# Patient Record
Sex: Male | Born: 1955 | Race: White | Hispanic: No | Marital: Married | Smoking: Former smoker
Health system: Southern US, Community
[De-identification: ages and names within clinical notes are randomized; demographics above are authoritative.]

## PROBLEM LIST (undated history)

## (undated) DIAGNOSIS — E785 Hyperlipidemia, unspecified: Secondary | ICD-10-CM

## (undated) DIAGNOSIS — E119 Type 2 diabetes mellitus without complications: Secondary | ICD-10-CM

## (undated) DIAGNOSIS — I10 Essential (primary) hypertension: Secondary | ICD-10-CM

## (undated) HISTORY — PX: EYE SURGERY: SHX253

## (undated) HISTORY — DX: Essential (primary) hypertension: I10

## (undated) HISTORY — DX: Hyperlipidemia, unspecified: E78.5

## (undated) HISTORY — DX: Type 2 diabetes mellitus without complications: E11.9

## (undated) HISTORY — PX: CYST EXCISION: SHX5701

---

## 2011-08-21 ENCOUNTER — Ambulatory Visit: Payer: Self-pay | Admitting: Internal Medicine

## 2011-08-21 LAB — URINALYSIS, COMPLETE
Bacteria: NEGATIVE
Bilirubin,UR: NEGATIVE
Glucose,UR: 100 mg/dL (ref 0–75)
Leukocyte Esterase: NEGATIVE
Nitrite: NEGATIVE

## 2011-08-22 LAB — URINE CULTURE

## 2011-08-28 ENCOUNTER — Ambulatory Visit: Payer: Self-pay | Admitting: Family Medicine

## 2011-09-03 ENCOUNTER — Ambulatory Visit: Payer: Self-pay | Admitting: Urology

## 2011-09-17 ENCOUNTER — Ambulatory Visit: Payer: Self-pay | Admitting: Family Medicine

## 2011-09-23 ENCOUNTER — Ambulatory Visit: Payer: Self-pay | Admitting: Family Medicine

## 2013-01-19 ENCOUNTER — Ambulatory Visit: Payer: Self-pay | Admitting: Urology

## 2016-07-07 ENCOUNTER — Other Ambulatory Visit: Payer: Self-pay | Admitting: Nurse Practitioner

## 2016-07-07 DIAGNOSIS — F0390 Unspecified dementia without behavioral disturbance: Secondary | ICD-10-CM

## 2016-07-23 ENCOUNTER — Ambulatory Visit: Admission: RE | Admit: 2016-07-23 | Payer: PRIVATE HEALTH INSURANCE | Source: Ambulatory Visit

## 2016-08-18 ENCOUNTER — Ambulatory Visit: Admission: RE | Admit: 2016-08-18 | Payer: PRIVATE HEALTH INSURANCE | Source: Ambulatory Visit

## 2016-08-26 ENCOUNTER — Ambulatory Visit
Admission: RE | Admit: 2016-08-26 | Discharge: 2016-08-26 | Disposition: A | Discharge status: Home / Self Care | Payer: PRIVATE HEALTH INSURANCE | Source: Ambulatory Visit | Attending: Nurse Practitioner | Admitting: Nurse Practitioner

## 2016-08-26 DIAGNOSIS — F0280 Dementia in other diseases classified elsewhere without behavioral disturbance: Secondary | ICD-10-CM | POA: Insufficient documentation

## 2016-08-26 DIAGNOSIS — F0390 Unspecified dementia without behavioral disturbance: Secondary | ICD-10-CM | POA: Diagnosis present

## 2016-08-26 DIAGNOSIS — G319 Degenerative disease of nervous system, unspecified: Secondary | ICD-10-CM | POA: Diagnosis not present

## 2016-08-26 MED ORDER — GADOBENATE DIMEGLUMINE 529 MG/ML IV SOLN
20.0000 mL | Freq: Once | INTRAVENOUS | Status: AC | PRN
  Administered 2016-08-26: 20 mL via INTRAVENOUS

## 2016-09-15 ENCOUNTER — Encounter (INDEPENDENT_AMBULATORY_CARE_PROVIDER_SITE_OTHER): Payer: Self-pay | Admitting: Vascular Surgery

## 2016-09-15 ENCOUNTER — Ambulatory Visit (INDEPENDENT_AMBULATORY_CARE_PROVIDER_SITE_OTHER): Payer: PRIVATE HEALTH INSURANCE | Admitting: Vascular Surgery

## 2016-09-15 VITALS — BP 129/73 | HR 73 | Resp 17 | Ht 67.0 in | Wt 173.2 lb

## 2016-09-15 DIAGNOSIS — E119 Type 2 diabetes mellitus without complications: Secondary | ICD-10-CM | POA: Diagnosis not present

## 2016-09-15 DIAGNOSIS — I10 Essential (primary) hypertension: Secondary | ICD-10-CM | POA: Insufficient documentation

## 2016-09-15 DIAGNOSIS — Q282 Arteriovenous malformation of cerebral vessels: Secondary | ICD-10-CM

## 2016-09-15 DIAGNOSIS — E785 Hyperlipidemia, unspecified: Secondary | ICD-10-CM

## 2016-09-15 NOTE — Assessment & Plan Note (Signed)
The patient has headaches and early onset Alzheimer's dementia. He underwent an MRI study which I have independently reviewed. 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 Given this finding, I believe he should be referred to a neurosurgeon for further evaluation and treatment. We had a long discussion about AV malformations. We discussed that I treat AV malformations throughout much of the body, but I do not treat intracranial AV malformations. We will try to obtain a referral in the near future at his convenience. I was able to answer several questions regarding AV malformations including their natural history, pathophysiology, and treatment options that may be available. I will defer to the neurosurgeon to discuss whether or not this should be treated and the best treatment option.

## 2016-09-15 NOTE — Patient Instructions (Signed)
Brain Arteriovenous Malformation, Adult A brain arteriovenous malformation (AVM) is a condition that means your arteries and veins are tangled. The veins bring blood to the heart and lungs. The arteries bring blood away from the heart and to the brain. If they are tangled, blood cannot travel to where it is needed. Brain AVM may also lead to bleeding in the brain (hemorrhage), which can be life-threatening. Most brain AVMs are present since birth (congenital). What are the causes? It is not known what causes brain AVM. Genes that are passed down through families may cause some types of AVM. What are the signs or symptoms? Symptoms of this condition depend on which area of the brain is affected and the amount of damage. Symptoms may include:  Headache.  Dizziness.  Vision changes.  Seizure.  Weakness or loss of movement in part of the body.  Tingling or numbness in part of the body.  Loss of ability to speak.  Clumsiness.  Confusion or memory loss.  Seeing things that are not there (hallucinations).  Fainting, if the AVM ruptures. You may not have any symptoms. How is this diagnosed? Your health care provider may suspect a brain AVM based on your symptoms and medical history. You will have a physical exam and will also have tests done, which may include:  MRI or magnetic resonance angiogram (MRA).  CT scan or CT angiogram (CTA).  Cerebral angiogram.  Electroencephalogram (EEG) if your health care provider thinks that you have had a seizure. How is this treated? Treatment will depend on the size, location, and severity of the brain AVM. Treatment may include:  Surgery to remove the AVM (craniotomy).  Embolization. This involves closing off the vessels of the AVM by injecting glue into them.  Radiosurgery. This involves focusing radiation on the AVM.  Medicines to control your symptoms, such as seizures or headaches.  Monitoring the AVM with imaging tests to make sure  it is not growing. Follow these instructions at home:  Learn as much as you can about your condition and work closely with your team of health care providers.  Take over-the-counter and prescription medicines only as told by your health care provider. Do not take blood thinners, such as aspirin, ibuprofen, NSAIDs, or warfarin, unless your health care provider tells you to do that.  Keep all follow-up visits as told by your health care provider. This is important. Get help right away if:  You have a sudden, severe headache with no known cause.  You have nausea or vomiting occurring with another symptom.  You have sudden weakness or numbness of your face, arm, or leg, especially on one side of your body.  You have sudden trouble walking or difficulty moving your arms or legs.  You have sudden confusion.  You have sudden trouble speaking, understanding, or both (aphasia).  You have sudden trouble seeing in one or both of your eyes.  You have a sudden loss of balance or coordination.  You have a stiff neck.  You have difficulty breathing.  You have partial or total loss of consciousness. These symptoms may represent a serious problem that is an emergency. Do not wait to see if the symptoms will go away. Get medical help right away. Call your local emergency services (911 in the [place] Do not drive yourself to the hospital.  This information is not intended to replace advice given to you by your health care provider. Make sure you discuss any questions you have with your health care  provider. Document Released: 07/03/2002 Document Revised: 01/31/2016 Document Reviewed: 11/22/2015 Elsevier Interactive Patient Education  2017 [employer]

## 2016-09-15 NOTE — Progress Notes (Signed)
Patient ID: Jesus Acevedo, male   DOB: 05-09-1956, 61 y.o.   MRN: [id number]  Chief Complaint  Patient presents with  . New Patient (Initial Visit)    HPI Jesus Acevedo is a 61 y.o. male.  I am asked to see the patient by Dr. Sherryll Burger for evaluation of an intracranial AVM.  The patient reports Dull aching headaches much of the time. These have not really changed in their intensity or frequency. There is no clear inciting event or causative factor that started them. They have been present for months to years. He has been recently diagnosed with Alzheimer's disease which was one of the reasons for the MRI study. Cognition and memory have been slowly declining. Again, there is no clear inciting event or causative factor. He underwent an MRI study which I have independently reviewed. 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 He is referred for further evaluation and treatment.   Past Medical History:  Diagnosis Date  . Diabetes mellitus without complication (HCC)   . Hyperlipidemia   . Hypertension     Past Surgical History:  Procedure Laterality Date  . CYST EXCISION    . EYE SURGERY      Family History  Problem Relation Age of Onset  . Hypertension Mother   . Aortic aneurysm Father   . Congestive Heart Failure Father   . Heart attack Father   . Hyperlipidemia Father   . Hypertension Father   . Cancer Paternal Grandmother   . Hypertension Paternal Grandmother   . Heart attack Paternal Grandfather   . Hyperlipidemia Paternal Grandfather   . Hypertension Paternal Grandfather      Social History Social History  Substance Use Topics  . Smoking status: Former Games developer  . Smokeless tobacco: Never Used  . Alcohol use Yes     Comment: occasionally  Married, wife accompanies him today.  No Known Allergies  Current Outpatient Prescriptions  Medication Sig Dispense Refill  . aspirin EC 81 MG tablet Take by mouth.    . donepezil (ARICEPT ODT) 10  MG disintegrating tablet Once Daily in the Morning with Food    . losartan-hydrochlorothiazide (HYZAAR) 50-12.5 MG tablet TAKE ONE TABLET BY MOUTH ONCE DAILY    . lovastatin (MEVACOR) 40 MG tablet Take by mouth.    . metFORMIN (GLUCOPHAGE) 500 MG tablet Take by mouth.     No current facility-administered medications for this visit.       REVIEW OF SYSTEMS (Negative unless checked)  Constitutional: [] Weight loss  [] Fever  [] Chills Cardiac: [] Chest pain   [] Chest pressure   [] Palpitations   [] Shortness of breath when laying flat   [] Shortness of breath at rest   [] Shortness of breath with exertion. Vascular:  [] Pain in legs with walking   [] Pain in legs at rest   [] Pain in legs when laying flat   [] Claudication   [] Pain in feet when walking  [] Pain in feet at rest  [] Pain in feet when laying flat   [] History of DVT   [] Phlebitis   [] Swelling in legs   [] Varicose veins   [] Non-healing ulcers Pulmonary:   [] Uses home oxygen   [] Productive cough   [] Hemoptysis   [] Wheeze  [] COPD   [] Asthma Neurologic:  [] Dizziness  [] Blackouts   [] Seizures   [] History of stroke   [] History of TIA  [] Aphasia   [] Temporary blindness   [] Dysphagia   [] Weakness or numbness in arms   [] Weakness or  numbness in legs  X positive for memory issues, dementia, and headaches Musculoskeletal:  [] Arthritis   [] Joint swelling   [] Joint pain   [] Low back pain Hematologic:  [] Easy bruising  [] Easy bleeding   [] Hypercoagulable state   [] Anemic  [] Hepatitis Gastrointestinal:  [] Blood in stool   [] Vomiting blood  [] Gastroesophageal reflux/heartburn   [] Abdominal pain Genitourinary:  [] Chronic kidney disease   [] Difficult urination  [] Frequent urination  [] Burning with urination   [] Hematuria Skin:  [] Rashes   [] Ulcers   [] Wounds Psychological:  [] History of anxiety   []  History of major depression.    Physical Exam BP 129/73   Pulse 73   Resp 17   Ht 5\' 7"  (1.702 m)   Wt 173 lb 3.2 oz (78.6 kg)   BMI 27.13 kg/m  Gen:   WD/WN, NAD Head: [place]/AT, No temporalis wasting. Ear/Nose/Throat: Hearing grossly intact, nares w/o erythema or drainage, oropharynx w/o Erythema/Exudate Eyes: Conjunctiva clear, sclera non-icteric  Neck: trachea midline.  No JVD.  Pulmonary:  Good air movement, no use of accessory muscles Cardiac: RRR, normal S1, S2 Vascular:  Vessel Right Left  Radial Palpable Palpable                                   Gastrointestinal: soft, non-tender/non-distended. No guarding/reflex. No masses, surgical incisions, or scars. Musculoskeletal: M/S 5/5 throughout.  Extremities without ischemic changes.  No deformity or atrophy.  Neurologic:  Symmetrical.  Speech is fluent. Motor exam as listed above. Psychiatric: Judgment intact, Mood & affect appropriate for pt's clinical situation. Dermatologic: No rashes or ulcers noted.  No cellulitis or open wounds.    Radiology Mr Laqueta Jean Wo Contrast  Addendum Date: 09/01/2016   ADDENDUM REPORT: 09/01/2016 13:48 ADDENDUM: There is evidence of a small AVM centered in the right splenium of the corpus callosum with a 1.4 cm nidus. There is drainage into an enlarged vein along the medial ependymal surface of the atrium of the right lateral ventricle and into the right internal cerebral vein. There is also a draining vein which courses superiorly in the interhemispheric fissure along the right parietal lobe and eventually into the superior sagittal sinus near the vertex. There also appears to be an enlarged basal vein of Rosenthal on the right. The right PCA is asymmetrically enlarged and likely reflects the arterial supply. This is a Spetzler-Martin grade 2 AVM. This was reviewed via telephone with Webb Silversmith on 09/01/2016 at 1:30 p.m. Electronically Signed   By: Sebastian Ache M.D.   On: 09/01/2016 13:48   Result Date: 09/01/2016 CLINICAL DATA:  Dementia.  Headaches. EXAM: MRI HEAD WITHOUT AND WITH CONTRAST TECHNIQUE: Multiplanar, multiecho pulse sequences of the  brain and surrounding structures were obtained without and with intravenous contrast. CONTRAST:  20mL MULTIHANCE GADOBENATE DIMEGLUMINE 529 MG/ML IV SOLN COMPARISON:  None. FINDINGS: Brain: There is no evidence of acute infarct, intracranial hemorrhage, mass, midline shift, or extra-axial fluid collection. There is mild generalized cerebral atrophy evidenced by slight ventricular enlargement. The cerebral sulci are normal. No significant cerebral white matter disease is seen. No abnormal enhancement is identified. Vascular: Major intracranial vascular flow voids are preserved. Skull and upper cervical spine: Unremarkable bone marrow signal. Sinuses/Orbits: Unremarkable orbits.  No significant sinus disease. Other: None. IMPRESSION: 1. No acute intracranial abnormality or mass. 2. Mild cerebral atrophy. Electronically Signed: By: Sebastian Ache M.D. On: 08/26/2016 09:33    Labs No results found  for this or any previous visit (from the past 2160 hour(s)).  Assessment/Plan:  Diabetes (HCC) blood glucose control important in reducing the progression of atherosclerotic disease. Also, involved in wound healing. On appropriate medications.   Essential hypertension, benign blood pressure control important in reducing the progression of atherosclerotic disease. On appropriate oral medications.   Hyperlipidemia lipid control important in reducing the progression of atherosclerotic disease. Continue statin therapy   Cerebral AV malformation The patient has headaches and early onset Alzheimer's dementia. He underwent an MRI study which I have independently reviewed. 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 Given this finding, I believe he should be referred to a neurosurgeon for further evaluation and treatment. We had a long discussion about AV malformations. We discussed that I treat AV malformations throughout much of the body, but I do not treat intracranial AV  malformations. We will try to obtain a referral in the near future at his convenience. I was able to answer several questions regarding AV malformations including their natural history, pathophysiology, and treatment options that may be available. I will defer to the neurosurgeon to discuss whether or not this should be treated and the best treatment option.      Festus BarrenJason Kevron Patella 09/15/2016, 11:43 AM   This note was created with Dragon medical transcription system.  Any errors from dictation are unintentional.

## 2016-09-15 NOTE — Assessment & Plan Note (Signed)
lipid control important in reducing the progression of atherosclerotic disease. Continue statin therapy  

## 2016-09-15 NOTE — Assessment & Plan Note (Signed)
blood pressure control important in reducing the progression of atherosclerotic disease. On appropriate oral medications.  

## 2016-09-15 NOTE — Assessment & Plan Note (Signed)
blood glucose control important in reducing the progression of atherosclerotic disease. Also, involved in wound healing. On appropriate medications.  

## 2018-09-29 IMAGING — MR MR HEAD WO/W CM
9 of 10 series · 41 of 48 positions shown · IV contrast (multihance)
Comparison: None.

ADDENDUM:
There is evidence of a small AVM centered in the right splenium of
the corpus callosum with a 1.4 cm nidus. There is drainage into an
enlarged vein along the medial ependymal surface of the atrium of
the right lateral ventricle and into the right internal cerebral
vein. There is also a draining vein which courses superiorly in the
interhemispheric fissure along the right parietal lobe and
eventually into the superior sagittal sinus near the vertex. There
also appears to be an enlarged basal vein of Cebreros on the right.
The right PCA is asymmetrically enlarged and likely reflects the
arterial supply. This is Chen Brumbaugh grade 2 AVM. This was
reviewed via telephone with Ramalabasov Starostenkova on 09/01/2016 at [DATE]
p.m.
CLINICAL DATA: Dementia.  Headaches.

EXAM:
MRI HEAD WITHOUT AND WITH CONTRAST
TECHNIQUE: Multiplanar, multiecho pulse sequences of the brain and surrounding
structures were obtained without and with intravenous contrast.
CONTRAST:  20mL MULTIHANCE GADOBENATE DIMEGLUMINE 529 MG/ML IV SOLN

[Series 2: T1 · sagittal · 5.0mm · 0.45mm/px · 3 of 23 slices shown]
[im 1/23]
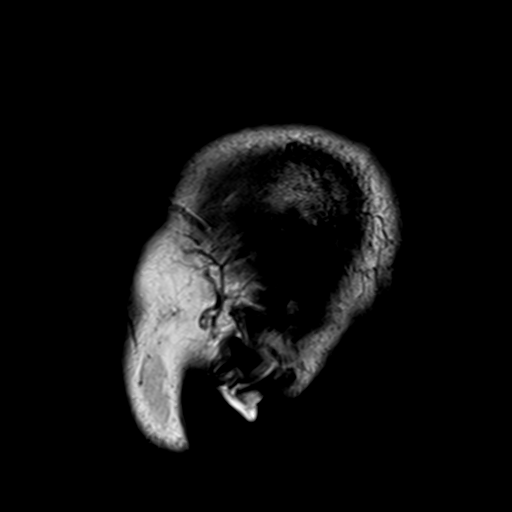
[im 12/23]
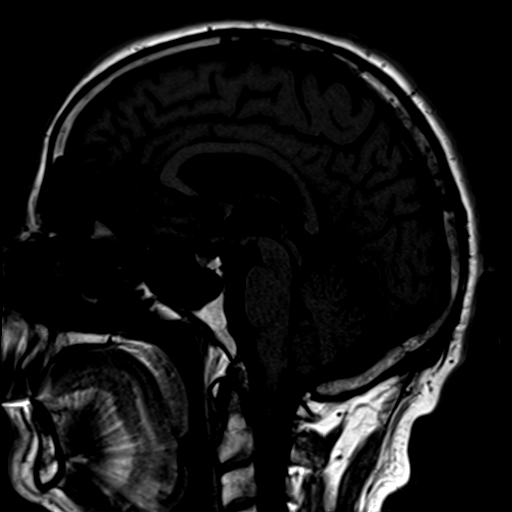
[im 23/23]
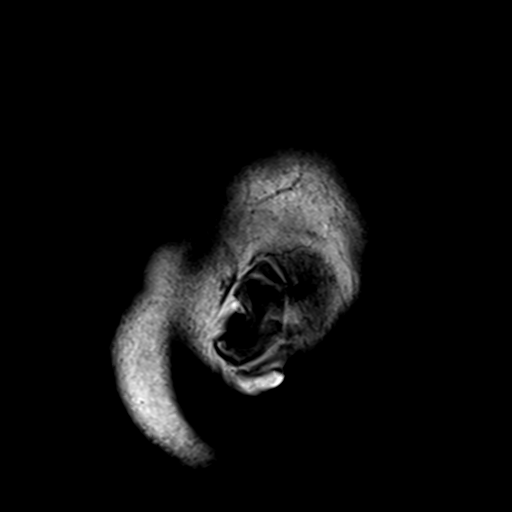

[Series 4: DWI · axial · 3.0mm · 1.20mm/px · z∈[-40,+129]mm · 7 of 59 slices shown (1 of 2)]
[im 1/59]
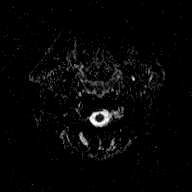
[im 10/59]
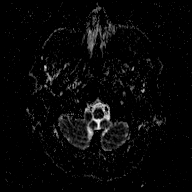
[im 20/59]
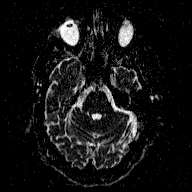
[im 30/59]
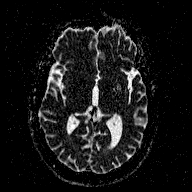
[im 39/59]
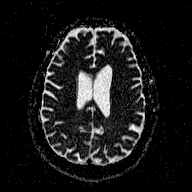
[im 49/59]
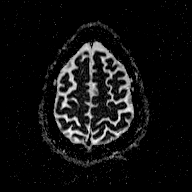
[im 59/59]
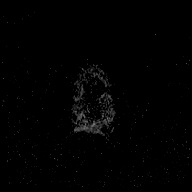

[Series 5: T2 · axial · 5.0mm · 0.72mm/px · z∈[-41,+130]mm · 3 of 28 slices shown (1 of 2)]
[im 1/28]
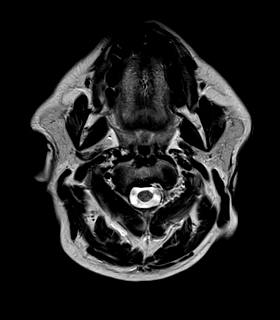
[im 14/28]
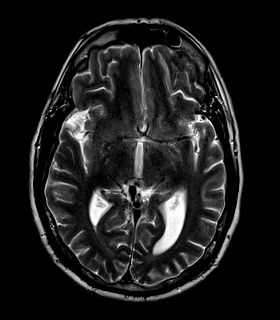
[im 28/28]
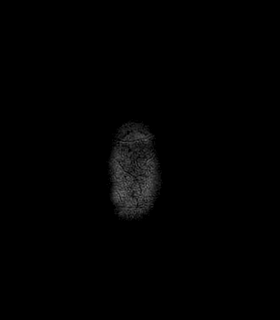

[Series 6: FLAIR · axial · 5.0mm · 0.45mm/px · z∈[-41,+130]mm · 3 of 28 slices shown]
[im 1/28]
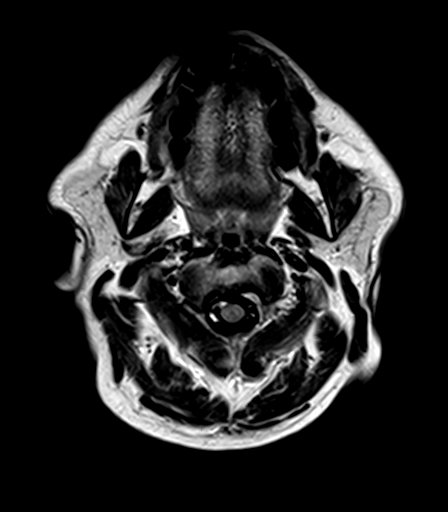
[im 14/28]
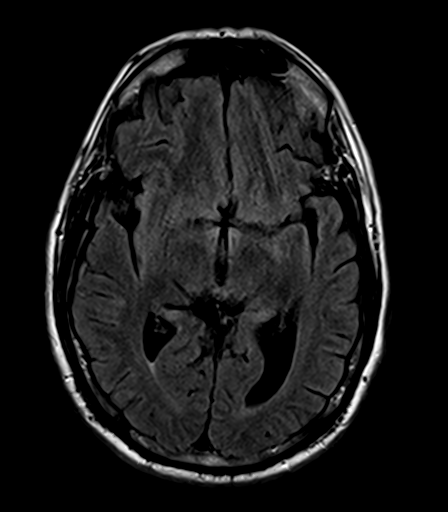
[im 28/28]
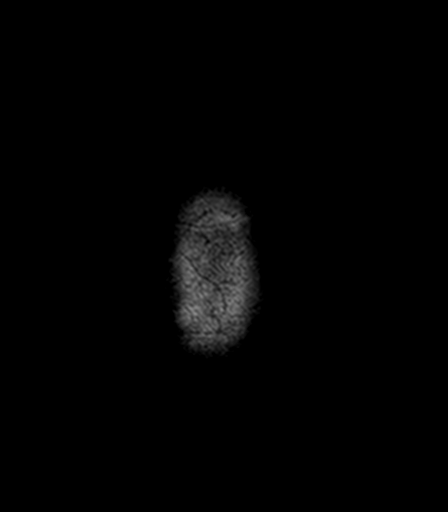

[Series 7: T2 · axial · 5.0mm · 0.72mm/px · z∈[-41,+130]mm · 3 of 28 slices shown (2 of 2)]
[im 1/28]
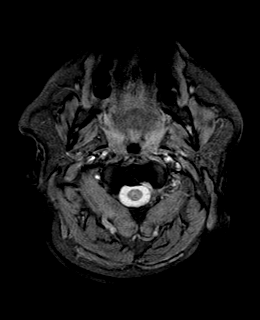
[im 14/28]
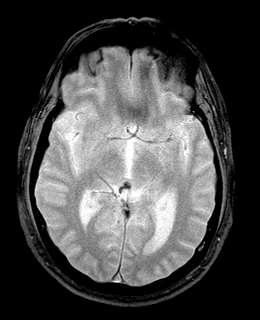
[im 28/28]
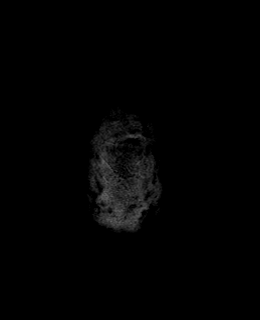

[Series 9: T2 post-contrast · coronal · 5.0mm · 0.45mm/px · 4 of 31 slices shown]
[im 1/31]
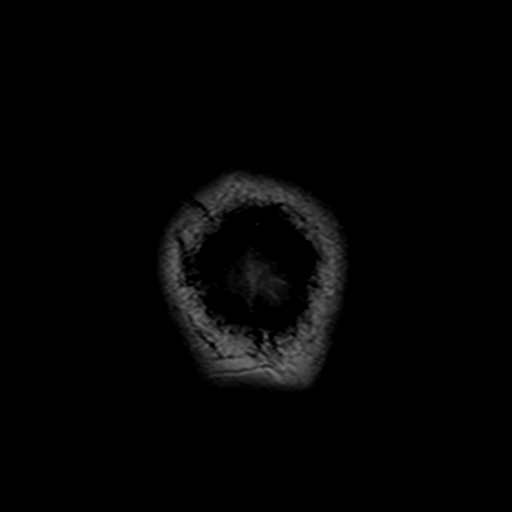
[im 11/31]
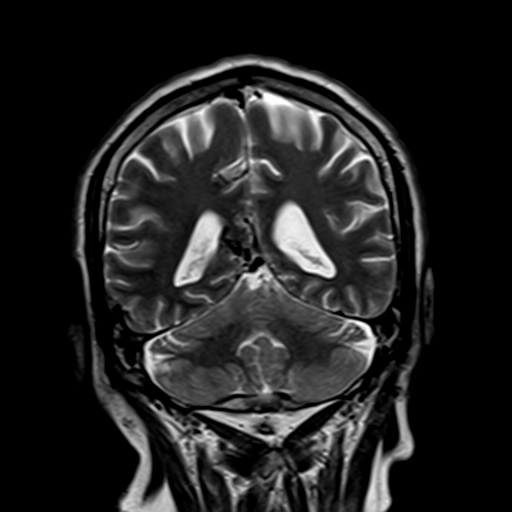
[im 21/31]
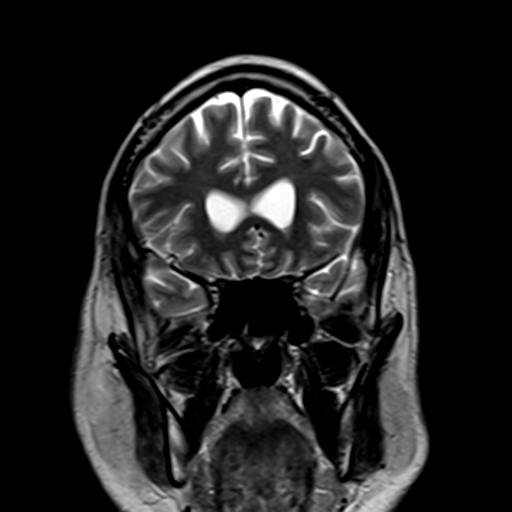
[im 31/31]
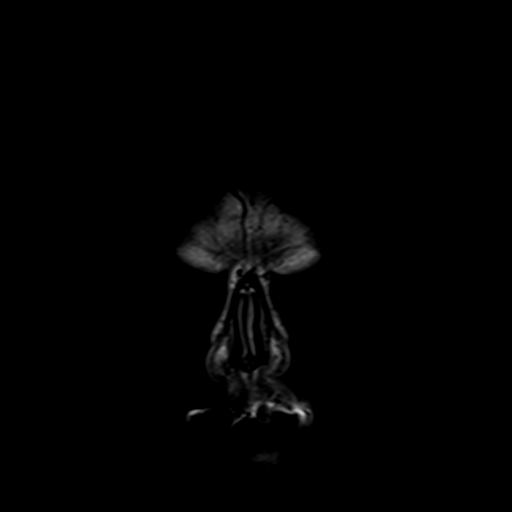

[Series 10: T1 post-contrast · axial · 3.0mm · 1.00mm/px · z∈[-44,+140]mm · 7 of 64 slices shown (1 of 2)]
[im 1/64]
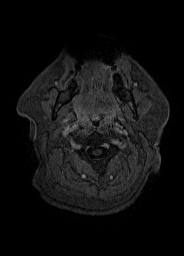
[im 11/64]
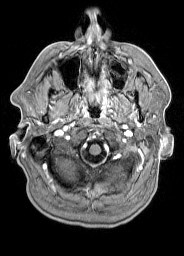
[im 22/64]
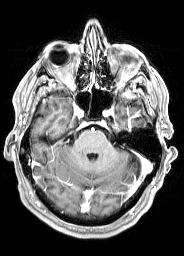
[im 32/64]
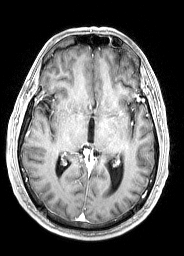
[im 43/64]
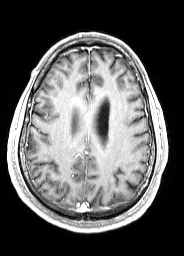
[im 53/64]
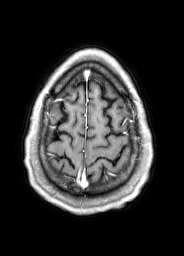
[im 64/64]
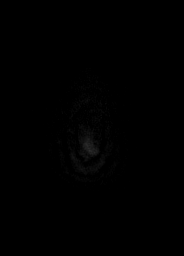

[Series 11: T1 post-contrast · coronal · 5.0mm · 0.45mm/px · 4 of 31 slices shown (2 of 2)]
[im 1/31]
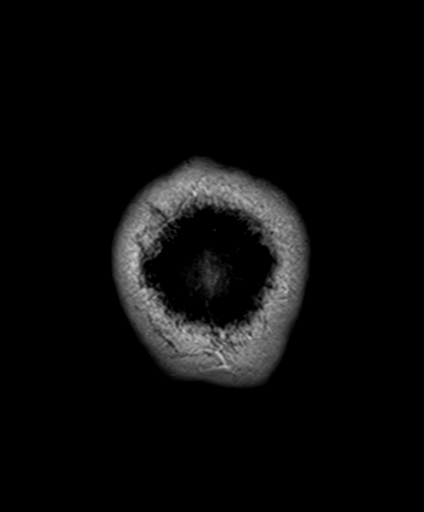
[im 11/31]
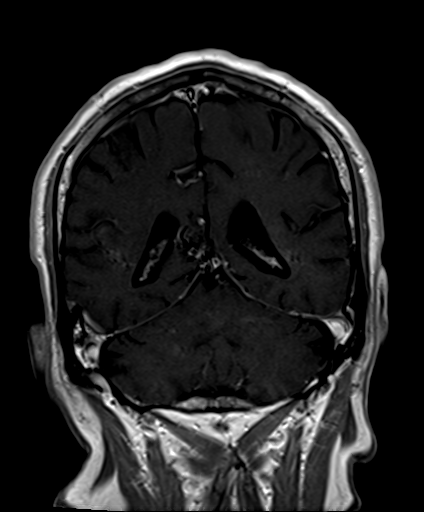
[im 21/31]
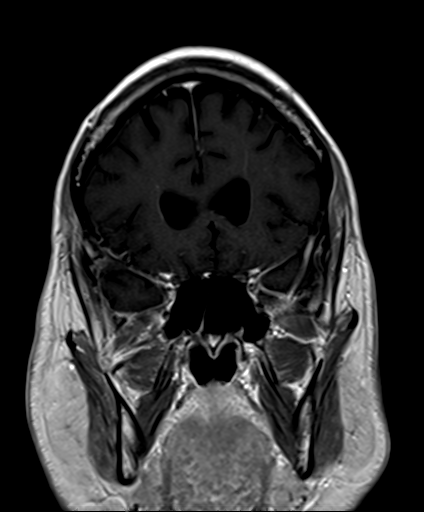
[im 31/31]
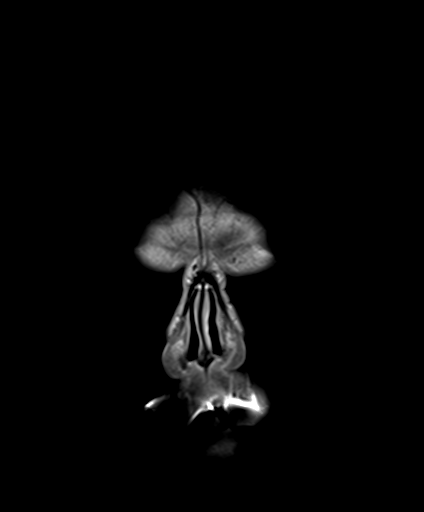

[Series 100: DWI · axial · 3.0mm · 1.20mm/px · z∈[-40,+129]mm · 7 of 59 slices shown (2 of 2)]
[im 1/59]
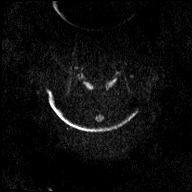
[im 10/59]
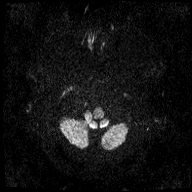
[im 20/59]
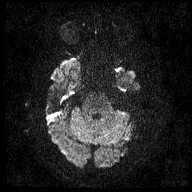
[im 30/59]
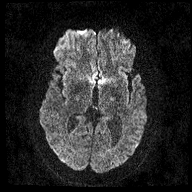
[im 39/59]
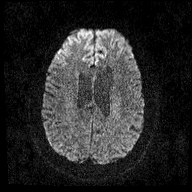
[im 49/59]
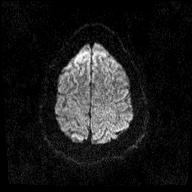
[im 59/59]
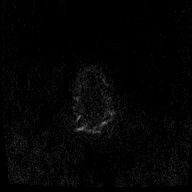

[41 of 48 positions shown; findings below may reference images not displayed]

FINDINGS: Brain: There is no evidence of acute infarct, intracranial
hemorrhage, mass, midline shift, or extra-axial fluid collection.
There is mild generalized cerebral atrophy evidenced by slight
ventricular enlargement. The cerebral sulci are normal. No
significant cerebral white matter disease is seen. No abnormal
enhancement is identified.

Vascular: Major intracranial vascular flow voids are preserved.

Skull and upper cervical spine: Unremarkable bone marrow signal.

Sinuses/Orbits: Unremarkable orbits.  No significant sinus disease.

Other: None.
IMPRESSION: 1. No acute intracranial abnormality or mass.
2. Mild cerebral atrophy.

## 2019-06-16 ENCOUNTER — Other Ambulatory Visit: Payer: Self-pay

## 2019-06-16 DIAGNOSIS — Z20822 Contact with and (suspected) exposure to covid-19: Secondary | ICD-10-CM

## 2019-06-19 LAB — NOVEL CORONAVIRUS, NAA: SARS-CoV-2, NAA: NOT DETECTED

## 2019-06-27 ENCOUNTER — Other Ambulatory Visit: Payer: Self-pay

## 2019-06-27 DIAGNOSIS — Z20822 Contact with and (suspected) exposure to covid-19: Secondary | ICD-10-CM

## 2019-06-29 LAB — NOVEL CORONAVIRUS, NAA: SARS-CoV-2, NAA: NOT DETECTED

## 2021-02-24 DEATH — deceased
